# Patient Record
Sex: Female | Born: 1955 | Race: White | Hispanic: No | Marital: Married | Smoking: Never smoker
Health system: Southern US, Community
[De-identification: ages and names within clinical notes are randomized; demographics above are authoritative.]

## PROBLEM LIST (undated history)

## (undated) DIAGNOSIS — E559 Vitamin D deficiency, unspecified: Secondary | ICD-10-CM

## (undated) DIAGNOSIS — M1990 Unspecified osteoarthritis, unspecified site: Secondary | ICD-10-CM

## (undated) DIAGNOSIS — F419 Anxiety disorder, unspecified: Secondary | ICD-10-CM

## (undated) DIAGNOSIS — Z860100 Personal history of colon polyps, unspecified: Secondary | ICD-10-CM

## (undated) DIAGNOSIS — Z872 Personal history of diseases of the skin and subcutaneous tissue: Secondary | ICD-10-CM

## (undated) DIAGNOSIS — D696 Thrombocytopenia, unspecified: Secondary | ICD-10-CM

## (undated) DIAGNOSIS — Z8719 Personal history of other diseases of the digestive system: Secondary | ICD-10-CM

## (undated) DIAGNOSIS — Z85820 Personal history of malignant melanoma of skin: Secondary | ICD-10-CM

## (undated) DIAGNOSIS — D649 Anemia, unspecified: Secondary | ICD-10-CM

## (undated) DIAGNOSIS — M810 Age-related osteoporosis without current pathological fracture: Secondary | ICD-10-CM

## (undated) DIAGNOSIS — E785 Hyperlipidemia, unspecified: Secondary | ICD-10-CM

## (undated) HISTORY — DX: Personal history of colon polyps, unspecified: Z86.0100

## (undated) HISTORY — DX: Thrombocytopenia, unspecified: D69.6

## (undated) HISTORY — PX: COLONOSCOPY W/ POLYPECTOMY: SHX1380

## (undated) HISTORY — DX: Age-related osteoporosis without current pathological fracture: M81.0

## (undated) HISTORY — PX: PARTIAL HYSTERECTOMY: SHX80

## (undated) HISTORY — DX: Personal history of malignant melanoma of skin: Z85.820

## (undated) HISTORY — DX: Anxiety disorder, unspecified: F41.9

## (undated) HISTORY — DX: Unspecified osteoarthritis, unspecified site: M19.90

## (undated) HISTORY — DX: Anemia, unspecified: D64.9

## (undated) HISTORY — PX: BREAST LUMPECTOMY: SHX2

## (undated) HISTORY — DX: Personal history of diseases of the skin and subcutaneous tissue: Z87.2

## (undated) HISTORY — DX: Hyperlipidemia, unspecified: E78.5

## (undated) HISTORY — PX: CATARACT EXTRACTION: SUR2

## (undated) HISTORY — PX: KNEE SURGERY: SHX244

## (undated) HISTORY — PX: BREAST BIOPSY: SHX20

## (undated) HISTORY — DX: Vitamin D deficiency, unspecified: E55.9

## (undated) HISTORY — DX: Personal history of other diseases of the digestive system: Z87.19

---

## 2015-10-13 DIAGNOSIS — M810 Age-related osteoporosis without current pathological fracture: Secondary | ICD-10-CM | POA: Insufficient documentation

## 2016-02-24 DIAGNOSIS — D2239 Melanocytic nevi of other parts of face: Secondary | ICD-10-CM | POA: Diagnosis not present

## 2016-02-24 DIAGNOSIS — D225 Melanocytic nevi of trunk: Secondary | ICD-10-CM | POA: Diagnosis not present

## 2016-02-24 DIAGNOSIS — L821 Other seborrheic keratosis: Secondary | ICD-10-CM | POA: Diagnosis not present

## 2016-04-20 DIAGNOSIS — Z1231 Encounter for screening mammogram for malignant neoplasm of breast: Secondary | ICD-10-CM | POA: Diagnosis not present

## 2016-06-29 DIAGNOSIS — D696 Thrombocytopenia, unspecified: Secondary | ICD-10-CM | POA: Diagnosis not present

## 2016-06-29 DIAGNOSIS — E785 Hyperlipidemia, unspecified: Secondary | ICD-10-CM | POA: Diagnosis not present

## 2016-06-29 DIAGNOSIS — E559 Vitamin D deficiency, unspecified: Secondary | ICD-10-CM | POA: Diagnosis not present

## 2016-07-20 DIAGNOSIS — Z23 Encounter for immunization: Secondary | ICD-10-CM | POA: Diagnosis not present

## 2016-10-21 DIAGNOSIS — M1712 Unilateral primary osteoarthritis, left knee: Secondary | ICD-10-CM | POA: Diagnosis not present

## 2016-11-03 DIAGNOSIS — Z23 Encounter for immunization: Secondary | ICD-10-CM | POA: Diagnosis not present

## 2016-11-03 DIAGNOSIS — Z01419 Encounter for gynecological examination (general) (routine) without abnormal findings: Secondary | ICD-10-CM | POA: Diagnosis not present

## 2016-11-03 DIAGNOSIS — Z1239 Encounter for other screening for malignant neoplasm of breast: Secondary | ICD-10-CM | POA: Diagnosis not present

## 2017-01-05 DIAGNOSIS — E785 Hyperlipidemia, unspecified: Secondary | ICD-10-CM | POA: Diagnosis not present

## 2017-01-05 DIAGNOSIS — E559 Vitamin D deficiency, unspecified: Secondary | ICD-10-CM | POA: Diagnosis not present

## 2017-01-05 DIAGNOSIS — D696 Thrombocytopenia, unspecified: Secondary | ICD-10-CM | POA: Diagnosis not present

## 2017-02-22 DIAGNOSIS — D225 Melanocytic nevi of trunk: Secondary | ICD-10-CM | POA: Diagnosis not present

## 2017-02-22 DIAGNOSIS — L814 Other melanin hyperpigmentation: Secondary | ICD-10-CM | POA: Diagnosis not present

## 2017-02-22 DIAGNOSIS — D2239 Melanocytic nevi of other parts of face: Secondary | ICD-10-CM | POA: Diagnosis not present

## 2017-04-21 DIAGNOSIS — Z1231 Encounter for screening mammogram for malignant neoplasm of breast: Secondary | ICD-10-CM | POA: Diagnosis not present

## 2017-07-06 DIAGNOSIS — M810 Age-related osteoporosis without current pathological fracture: Secondary | ICD-10-CM | POA: Diagnosis not present

## 2017-07-06 DIAGNOSIS — E785 Hyperlipidemia, unspecified: Secondary | ICD-10-CM | POA: Diagnosis not present

## 2017-07-06 DIAGNOSIS — D696 Thrombocytopenia, unspecified: Secondary | ICD-10-CM | POA: Diagnosis not present

## 2017-07-06 DIAGNOSIS — E559 Vitamin D deficiency, unspecified: Secondary | ICD-10-CM | POA: Diagnosis not present

## 2017-07-26 DIAGNOSIS — M810 Age-related osteoporosis without current pathological fracture: Secondary | ICD-10-CM | POA: Diagnosis not present

## 2017-07-26 DIAGNOSIS — M8589 Other specified disorders of bone density and structure, multiple sites: Secondary | ICD-10-CM | POA: Diagnosis not present

## 2017-10-22 DIAGNOSIS — Z23 Encounter for immunization: Secondary | ICD-10-CM | POA: Diagnosis not present

## 2017-11-04 DIAGNOSIS — Z01818 Encounter for other preprocedural examination: Secondary | ICD-10-CM | POA: Diagnosis not present

## 2017-11-04 DIAGNOSIS — H2511 Age-related nuclear cataract, right eye: Secondary | ICD-10-CM | POA: Diagnosis not present

## 2017-11-25 DIAGNOSIS — H2511 Age-related nuclear cataract, right eye: Secondary | ICD-10-CM | POA: Diagnosis not present

## 2017-11-25 DIAGNOSIS — Z01818 Encounter for other preprocedural examination: Secondary | ICD-10-CM | POA: Diagnosis not present

## 2017-12-05 DIAGNOSIS — Z1239 Encounter for other screening for malignant neoplasm of breast: Secondary | ICD-10-CM | POA: Diagnosis not present

## 2017-12-05 DIAGNOSIS — Z01419 Encounter for gynecological examination (general) (routine) without abnormal findings: Secondary | ICD-10-CM | POA: Diagnosis not present

## 2017-12-05 DIAGNOSIS — N838 Other noninflammatory disorders of ovary, fallopian tube and broad ligament: Secondary | ICD-10-CM | POA: Diagnosis not present

## 2017-12-12 DIAGNOSIS — N838 Other noninflammatory disorders of ovary, fallopian tube and broad ligament: Secondary | ICD-10-CM | POA: Diagnosis not present

## 2017-12-13 DIAGNOSIS — H04123 Dry eye syndrome of bilateral lacrimal glands: Secondary | ICD-10-CM | POA: Diagnosis not present

## 2017-12-13 DIAGNOSIS — H35033 Hypertensive retinopathy, bilateral: Secondary | ICD-10-CM | POA: Diagnosis not present

## 2017-12-13 DIAGNOSIS — H2511 Age-related nuclear cataract, right eye: Secondary | ICD-10-CM | POA: Diagnosis not present

## 2017-12-13 DIAGNOSIS — H259 Unspecified age-related cataract: Secondary | ICD-10-CM | POA: Diagnosis not present

## 2018-01-03 DIAGNOSIS — D696 Thrombocytopenia, unspecified: Secondary | ICD-10-CM | POA: Diagnosis not present

## 2018-01-03 DIAGNOSIS — E785 Hyperlipidemia, unspecified: Secondary | ICD-10-CM | POA: Diagnosis not present

## 2018-01-03 DIAGNOSIS — M810 Age-related osteoporosis without current pathological fracture: Secondary | ICD-10-CM | POA: Diagnosis not present

## 2018-01-03 DIAGNOSIS — E559 Vitamin D deficiency, unspecified: Secondary | ICD-10-CM | POA: Diagnosis not present

## 2018-01-10 DIAGNOSIS — H259 Unspecified age-related cataract: Secondary | ICD-10-CM | POA: Diagnosis not present

## 2018-01-10 DIAGNOSIS — H40013 Open angle with borderline findings, low risk, bilateral: Secondary | ICD-10-CM | POA: Diagnosis not present

## 2018-01-10 DIAGNOSIS — E785 Hyperlipidemia, unspecified: Secondary | ICD-10-CM | POA: Diagnosis not present

## 2018-01-10 DIAGNOSIS — H2512 Age-related nuclear cataract, left eye: Secondary | ICD-10-CM | POA: Diagnosis not present

## 2018-03-07 DIAGNOSIS — L814 Other melanin hyperpigmentation: Secondary | ICD-10-CM | POA: Diagnosis not present

## 2018-03-07 DIAGNOSIS — D2239 Melanocytic nevi of other parts of face: Secondary | ICD-10-CM | POA: Diagnosis not present

## 2018-03-07 DIAGNOSIS — D225 Melanocytic nevi of trunk: Secondary | ICD-10-CM | POA: Diagnosis not present

## 2018-03-23 DIAGNOSIS — G8929 Other chronic pain: Secondary | ICD-10-CM | POA: Insufficient documentation

## 2018-03-23 DIAGNOSIS — M25511 Pain in right shoulder: Secondary | ICD-10-CM | POA: Diagnosis not present

## 2019-01-24 ENCOUNTER — Encounter: Payer: Self-pay | Admitting: Sports Medicine

## 2019-01-24 ENCOUNTER — Ambulatory Visit (INDEPENDENT_AMBULATORY_CARE_PROVIDER_SITE_OTHER): Payer: Commercial Managed Care - PPO

## 2019-01-24 ENCOUNTER — Ambulatory Visit (INDEPENDENT_AMBULATORY_CARE_PROVIDER_SITE_OTHER): Payer: Commercial Managed Care - PPO | Admitting: Sports Medicine

## 2019-01-24 ENCOUNTER — Other Ambulatory Visit: Payer: Self-pay | Admitting: Sports Medicine

## 2019-01-24 ENCOUNTER — Ambulatory Visit: Payer: Self-pay

## 2019-01-24 ENCOUNTER — Other Ambulatory Visit: Payer: Self-pay

## 2019-01-24 DIAGNOSIS — M779 Enthesopathy, unspecified: Secondary | ICD-10-CM

## 2019-01-24 DIAGNOSIS — M79671 Pain in right foot: Secondary | ICD-10-CM

## 2019-01-24 DIAGNOSIS — M7751 Other enthesopathy of right foot: Secondary | ICD-10-CM

## 2019-01-24 DIAGNOSIS — M7741 Metatarsalgia, right foot: Secondary | ICD-10-CM | POA: Diagnosis not present

## 2019-01-24 DIAGNOSIS — M792 Neuralgia and neuritis, unspecified: Secondary | ICD-10-CM

## 2019-01-24 MED ORDER — PREDNISONE 10 MG (21) PO TBPK: ORAL_TABLET | ORAL | 0 refills | Status: AC

## 2019-01-24 MED ORDER — TRIAMCINOLONE ACETONIDE 10 MG/ML IJ SUSP
10.0000 mg | Freq: Once | INTRAMUSCULAR | Status: AC
  Administered 2019-01-24: 10 mg

## 2019-01-24 NOTE — Progress Notes (Signed)
Subjective: Elizabeth Randall is a 63 y.o. female patient who presents to office for evaluation of right foot pain. Patient complains of progressive pain especially over the last 3 to 4 months in the right foot at the ball.  Ranks pain 7/10 and is now interferring with daily activities. Patient has tried changing shoes, padding, insoles, tramadol with no relief in symptoms. Patient denies any other pedal complaints. Denies injury/trip/fall/sprain/any causative factors.   Review of Systems  All other systems reviewed and are negative.    There are no problems to display for this patient.   No current outpatient medications on file prior to visit.   No current facility-administered medications on file prior to visit.    Allergies  Allergen Reactions  . Celecoxib Nausea Only    UPSETS STOMACH  . Nisoldipine Other (See Comments)    unknown  . Piroxicam Other (See Comments)    TONGUE SWELLS  . Rofecoxib Rash    Objective:  General: Alert and oriented x3 in no acute distress  Dermatology: No open lesions bilateral lower extremities, no webspace macerations, no ecchymosis bilateral, all nails x 10 are well manicured.  Vascular: Dorsalis Pedis and Posterior Tibial pedal pulses palpable, Capillary Fill Time 3 seconds,(+) pedal hair growth bilateral, no edema bilateral lower extremities, Temperature gradient within normal limits.  Neurology: Johney Maine sensation intact via light touch bilateral.  Musculoskeletal: Mild tenderness with palpation at right third metatarsal phalangeal joint as well as with compression of the metatarsal heads in between the second and third metatarsals of the right foot.  There is fat pad atrophy of the right foot.  Mild digital contractures noted.  No other acute findings. Strength within normal limits in all groups bilateral.   Gait: Antalgic gait  Xrays  Right Foot   Impression: Normal osseous mineralization, there is mild soft tissue swelling noted at the plantar  forefoot, metatarsal length of the second and third metatarsals are almost equal there is mild digital contracture no fracture or significant dislocation, no other acute findings.  Assessment and Plan: Problem List Items Addressed This Visit    None    Visit Diagnoses    Capsulitis    -  Primary   Relevant Medications   triamcinolone acetonide (KENALOG) 10 MG/ML injection 10 mg (Completed) (Start on 01/24/2019  8:30 PM)   Other Relevant Orders   DG Foot Complete Right (Completed)   Neuritis       Right foot pain       Relevant Medications   triamcinolone acetonide (KENALOG) 10 MG/ML injection 10 mg (Completed) (Start on 01/24/2019  8:30 PM)   Metatarsalgia of right foot           -Complete examination performed -Xrays reviewed -Discussed treatment options for capsulitis versus neuroma right foot -Rx prednisone Dosepak to take as instructed -Advised rest from activity and once it starts to feel better may slowly increase back to walking -After oral consent and aseptic prep, injected a mixture containing 1 ml of 2%  plain lidocaine, 1 ml 0.5% plain marcaine, 0.5 ml of kenalog 10 and 0.5 ml of dexamethasone phosphate into the right third metatarsophalangeal joint via a plantar approach without complication. Post-injection care discussed with patient.  -Applied strapping to plantar forefoot to offload the metatarsals and instructed patient on proper use of strapping that is removable -Recommend good supportive shoes rest ice elevation and continue with topical pain creams or rubs as needed -Patient to return to office in 1 month or sooner if  condition worsens.  Advised patient if she still continues to be painful may benefit from Cam boot and MRI.  Landis Martins, DPM

## 2019-02-15 ENCOUNTER — Ambulatory Visit: Payer: Commercial Managed Care - PPO | Admitting: Sports Medicine

## 2019-02-15 ENCOUNTER — Other Ambulatory Visit: Payer: Self-pay

## 2019-02-15 ENCOUNTER — Encounter: Payer: Self-pay | Admitting: Sports Medicine

## 2019-02-15 DIAGNOSIS — M7741 Metatarsalgia, right foot: Secondary | ICD-10-CM | POA: Diagnosis not present

## 2019-02-15 DIAGNOSIS — M792 Neuralgia and neuritis, unspecified: Secondary | ICD-10-CM

## 2019-02-15 DIAGNOSIS — M79671 Pain in right foot: Secondary | ICD-10-CM | POA: Diagnosis not present

## 2019-02-15 DIAGNOSIS — M779 Enthesopathy, unspecified: Secondary | ICD-10-CM | POA: Diagnosis not present

## 2019-02-15 NOTE — Progress Notes (Signed)
Subjective: Elizabeth Randall is a 64 y.o. female patient who returns to office for follow-up evaluation of right foot pain.  Patient reports that the pain is doing a lot better every once in a while she will get a quick bruising of pain that is about 2 out of 10 but a lot better patient reports that she has resumed walking for exercise with her husband without any problems and has been using her removable metatarsal padding which seems to be very helpful.  Patient denies any significant redness warmth swelling drainage or bruising since after receiving injection last visit.  No other pedal complaints noted.  There are no problems to display for this patient.   Current Outpatient Medications on File Prior to Visit  Medication Sig Dispense Refill  . ALPRAZolam (XANAX) 0.25 MG tablet Take 0.25 mg by mouth 3 (three) times daily as needed.    . AMITIZA 8 MCG capsule Take 8 mcg by mouth 2 (two) times daily.    . citalopram (CELEXA) 20 MG tablet Take 20 mg by mouth daily.    Marland Kitchen HYDROcodone-acetaminophen (NORCO/VICODIN) 5-325 MG tablet     . predniSONE (STERAPRED UNI-PAK 21 TAB) 10 MG (21) TBPK tablet Take as directed 21 tablet 0  . PREMARIN 0.3 MG tablet Take 0.3 mg by mouth daily.    Marland Kitchen PREMARIN vaginal cream     . simvastatin (ZOCOR) 20 MG tablet Take 20 mg by mouth at bedtime.    . traMADol (ULTRAM) 50 MG tablet      No current facility-administered medications on file prior to visit.    Allergies  Allergen Reactions  . Celecoxib Nausea Only    UPSETS STOMACH  . Nisoldipine Other (See Comments)    unknown  . Piroxicam Other (See Comments)    TONGUE SWELLS  . Rofecoxib Rash    Objective:  General: Alert and oriented x3 in no acute distress  Dermatology: No open lesions bilateral lower extremities, no webspace macerations, no ecchymosis bilateral, all nails x 10 are well manicured.  Vascular: Dorsalis Pedis and Posterior Tibial pedal pulses palpable, Capillary Fill Time 3 seconds,(+) pedal  hair growth bilateral, no edema bilateral lower extremities, Temperature gradient within normal limits.  Neurology: Johney Maine sensation intact via light touch bilateral.  Musculoskeletal: No reproducible tenderness with palpation at right third metatarsal phalangeal joint as well as no pain with compression of the metatarsal heads in between the second and third metatarsals of the right foot.  There is fat pad atrophy of the right foot.  Mild digital contractures noted.  No other acute findings. Strength within normal limits in all groups bilateral.   Assessment and Plan: Problem List Items Addressed This Visit    None    Visit Diagnoses    Capsulitis    -  Primary   Neuritis       Right foot pain       Metatarsalgia of right foot          -Complete examination performed -Re-Discussed continued care for capsulitis versus neuroma right foot -Patient to continue with metatarsal padding and once this padding wears out advised patient to get over-the-counter insoles or Dr. Felicie Morn gel metatarsal padding -Advised patient to avoid activities that may reexacerbate her foot pain -Continue with good supportive shoes daily for activity -Continue with close monitoring of pain worsens or reoccurs may benefit from reevaluation -Patient to return to office in as needed or sooner if problems or issues arise.  Landis Martins, DPM

## 2020-01-02 ENCOUNTER — Other Ambulatory Visit: Payer: Self-pay | Admitting: Orthopedic Surgery

## 2020-01-02 DIAGNOSIS — M542 Cervicalgia: Secondary | ICD-10-CM

## 2020-01-20 ENCOUNTER — Other Ambulatory Visit: Payer: Self-pay

## 2020-01-20 ENCOUNTER — Ambulatory Visit
Admission: RE | Admit: 2020-01-20 | Discharge: 2020-01-20 | Disposition: A | Discharge status: Home / Self Care | Payer: Commercial Managed Care - PPO | Source: Ambulatory Visit | Attending: Orthopedic Surgery | Admitting: Orthopedic Surgery

## 2020-01-20 DIAGNOSIS — M542 Cervicalgia: Secondary | ICD-10-CM

## 2020-03-12 ENCOUNTER — Other Ambulatory Visit: Payer: Self-pay

## 2020-03-12 ENCOUNTER — Emergency Department (HOSPITAL_COMMUNITY)
Admission: EM | Admit: 2020-03-12 | Discharge: 2020-03-12 | Disposition: A | Discharge status: Home / Self Care | Payer: Commercial Managed Care - PPO | Attending: Emergency Medicine | Admitting: Emergency Medicine

## 2020-03-12 ENCOUNTER — Encounter (HOSPITAL_COMMUNITY): Payer: Self-pay

## 2020-03-12 ENCOUNTER — Emergency Department (HOSPITAL_COMMUNITY): Payer: Commercial Managed Care - PPO

## 2020-03-12 DIAGNOSIS — R531 Weakness: Secondary | ICD-10-CM

## 2020-03-12 DIAGNOSIS — E860 Dehydration: Secondary | ICD-10-CM | POA: Diagnosis not present

## 2020-03-12 DIAGNOSIS — U071 COVID-19: Secondary | ICD-10-CM

## 2020-03-12 LAB — URINALYSIS, ROUTINE W REFLEX MICROSCOPIC
Bilirubin Urine: NEGATIVE
Glucose, UA: NEGATIVE mg/dL
Hgb urine dipstick: NEGATIVE
Ketones, ur: >80 mg/dL — AB
Leukocytes,Ua: NEGATIVE
Nitrite: NEGATIVE
Protein, ur: 30 mg/dL — AB
Specific Gravity, Urine: 1.02 (ref 1.005–1.030)
pH: 6 (ref 5.0–8.0)

## 2020-03-12 LAB — COMPREHENSIVE METABOLIC PANEL
ALT: 18 U/L (ref 0–44)
AST: 33 U/L (ref 15–41)
Albumin: 3.3 g/dL — ABNORMAL LOW (ref 3.5–5.0)
Alkaline Phosphatase: 44 U/L (ref 38–126)
Anion gap: 12 (ref 5–15)
BUN: 10 mg/dL (ref 8–23)
CO2: 24 mmol/L (ref 22–32)
Calcium: 8.6 mg/dL — ABNORMAL LOW (ref 8.9–10.3)
Chloride: 98 mmol/L (ref 98–111)
Creatinine, Ser: 0.55 mg/dL (ref 0.44–1.00)
GFR, Estimated: >60 mL/min (ref >60)
Glucose, Bld: 99 mg/dL (ref 70–99)
Potassium: 3.3 mmol/L — ABNORMAL LOW (ref 3.5–5.1)
Sodium: 134 mmol/L — ABNORMAL LOW (ref 135–145)
Total Bilirubin: 0.8 mg/dL (ref 0.3–1.2)
Total Protein: 5.9 g/dL — ABNORMAL LOW (ref 6.5–8.1)

## 2020-03-12 LAB — URINALYSIS, MICROSCOPIC (REFLEX)

## 2020-03-12 LAB — LACTIC ACID, PLASMA
Lactic Acid, Venous: 1.1 mmol/L (ref 0.5–1.9)
Lactic Acid, Venous: 1.3 mmol/L (ref 0.5–1.9)

## 2020-03-12 LAB — CBC WITH DIFFERENTIAL/PLATELET
Abs Immature Granulocytes: 0.01 10*3/uL (ref 0.00–0.07)
Basophils Absolute: 0 10*3/uL (ref 0.0–0.1)
Basophils Relative: 0 %
Eosinophils Absolute: 0 10*3/uL (ref 0.0–0.5)
Eosinophils Relative: 0 %
HCT: 37.7 % (ref 36.0–46.0)
Hemoglobin: 12.2 g/dL (ref 12.0–15.0)
Immature Granulocytes: 0 %
Lymphocytes Relative: 16 %
Lymphs Abs: 0.4 10*3/uL — ABNORMAL LOW (ref 0.7–4.0)
MCH: 28.8 pg (ref 26.0–34.0)
MCHC: 32.4 g/dL (ref 30.0–36.0)
MCV: 88.9 fL (ref 80.0–100.0)
Monocytes Absolute: 0.1 10*3/uL (ref 0.1–1.0)
Monocytes Relative: 5 %
Neutro Abs: 1.9 10*3/uL (ref 1.7–7.7)
Neutrophils Relative %: 79 %
Platelets: 85 10*3/uL — ABNORMAL LOW (ref 150–400)
RBC: 4.24 MIL/uL (ref 3.87–5.11)
RDW: 13.6 % (ref 11.5–15.5)
WBC: 2.5 10*3/uL — ABNORMAL LOW (ref 4.0–10.5)
nRBC: 0 % (ref 0.0–0.2)

## 2020-03-12 MED ORDER — IBUPROFEN 400 MG PO TABS
400.0000 mg | ORAL_TABLET | Freq: Once | ORAL | Status: AC
  Administered 2020-03-12: 400 mg via ORAL
  Filled 2020-03-12: qty 1

## 2020-03-12 MED ORDER — ACETAMINOPHEN 325 MG PO TABS
650.0000 mg | ORAL_TABLET | Freq: Once | ORAL | Status: AC | PRN
  Administered 2020-03-12: 650 mg via ORAL
  Filled 2020-03-12: qty 2

## 2020-03-12 MED ORDER — ONDANSETRON HCL 4 MG/2ML IJ SOLN
4.0000 mg | Freq: Once | INTRAMUSCULAR | Status: AC
  Administered 2020-03-12: 4 mg via INTRAVENOUS
  Filled 2020-03-12: qty 2

## 2020-03-12 MED ORDER — ONDANSETRON 4 MG PO TBDP: 4.0000 mg | ORAL_TABLET | Freq: Three times a day (TID) | ORAL | 0 refills | Status: AC | PRN

## 2020-03-12 MED ORDER — PREDNISONE 20 MG PO TABS: 40.0000 mg | ORAL_TABLET | Freq: Every day | ORAL | 0 refills | Status: AC

## 2020-03-12 MED ORDER — LACTATED RINGERS IV BOLUS
1000.0000 mL | Freq: Once | INTRAVENOUS | Status: AC
  Administered 2020-03-12: 1000 mL via INTRAVENOUS

## 2020-03-12 NOTE — ED Provider Notes (Signed)
[facility]PART[facility] Provider Note   CSN: [id number] Arrival date & time: 03/12/20  1205     History No chief complaint on file.   Elizabeth Randall is a 65 y.o. female.  Patient is a 65 year old female with a history of hyperlipidemia, constipation who is unvaccinated and presenting today with generalized weakness, nausea, poor appetite and fatigue. Patient symptoms have been present for the last 10 days. Patient did test positive for Covid last week. She has been isolating at home with her husband who is also sick. She has had some cough but denies any shortness of breath. She has no desire to eat and reports even when she tries to fix something the nausea is preventing her from eating anything. She just feels generally weak and when she stands and tries to ambulate she feels like she might pass out. She has not received any medications since being diagnosed with COVID or received any transfusions.  The history is provided by the patient.       History reviewed. No pertinent past medical history.  There are no problems to display for this patient.   History reviewed. No pertinent surgical history.   OB History   No obstetric history on file.     No family history on file.     Home Medications Prior to Admission medications   Medication Sig Start Date End Date Taking? Authorizing Provider  ALPRAZolam (XANAX) 0.25 MG tablet Take 0.25 mg by mouth 3 (three) times daily as needed for anxiety. 02/12/20  Yes [provider]  AMITIZA 8 MCG capsule Take 8 mcg by mouth 2 (two) times daily. 03/03/20  Yes [provider]  citalopram (CELEXA) 20 MG tablet Take 20 mg by mouth daily. 01/29/20  Yes [provider]  PREMARIN 0.3 MG tablet Take 0.3 mg by mouth daily. 03/03/20  Yes [provider]  simvastatin (ZOCOR) 20 MG tablet Take 20 mg by mouth at bedtime. 01/14/20  Yes [provider]    Allergies    Patient has  no known allergies.  Review of Systems   Review of Systems  All other systems reviewed and are negative.   Physical Exam Updated Vital Signs BP 97/64   Pulse 99   Temp (!) 102.3 F (39.1 C) (Oral)   Resp 18   SpO2 98%   Physical Exam Vitals and nursing note reviewed.  Constitutional:      General: She is not in acute distress.    Appearance: She is well-developed and well-nourished.  HENT:     Head: Normocephalic and atraumatic.     Mouth/Throat:     Mouth: Oropharynx is clear and moist.  Eyes:     Extraocular Movements: EOM normal.     Conjunctiva/sclera: Conjunctivae normal.     Pupils: Pupils are equal, round, and reactive to light.  Cardiovascular:     Rate and Rhythm: Normal rate and regular rhythm.     Pulses: Intact distal pulses.     Heart sounds: No murmur heard.   Pulmonary:     Effort: Pulmonary effort is normal. No respiratory distress.     Breath sounds: Normal breath sounds. No wheezing or rales.     Comments: Fine crackles noted in the bases on exam Abdominal:     General: There is no distension.     Palpations: Abdomen is soft.     Tenderness: There is no abdominal tenderness. There is no guarding or rebound.  Musculoskeletal:  General: No tenderness or edema. Normal range of motion.     Cervical back: Normal range of motion and neck supple.     Right lower leg: No edema.     Left lower leg: No edema.  Skin:    General: Skin is warm and dry.     Findings: No erythema or rash.  Neurological:     General: No focal deficit present.     Mental Status: She is alert and oriented to person, place, and time. Mental status is at baseline.  Psychiatric:        Mood and Affect: Mood and affect and mood normal.        Behavior: Behavior normal.        Thought Content: Thought content normal.     ED Results / Procedures / Treatments   Labs (all labs ordered are listed, but only abnormal results are displayed) Labs Reviewed  COMPREHENSIVE  METABOLIC PANEL - Abnormal; Notable for the following components:      Result Value   Sodium 134 (*)    Potassium 3.3 (*)    Calcium 8.6 (*)    Total Protein 5.9 (*)    Albumin 3.3 (*)    All other components within normal limits  CBC WITH DIFFERENTIAL/PLATELET - Abnormal; Notable for the following components:   WBC 2.5 (*)    Platelets 85 (*)    Lymphs Abs 0.4 (*)    All other components within normal limits  LACTIC ACID, PLASMA  LACTIC ACID, PLASMA  URINALYSIS, ROUTINE W REFLEX MICROSCOPIC    EKG None  Radiology DG Chest 1 View  Result Date: 03/12/2020 CLINICAL DATA:  COVID positive 3 days, dyspnea, weakness, vomiting EXAM: CHEST  1 VIEW COMPARISON:  10/18/2011 chest radiograph. FINDINGS: Stable cardiomediastinal silhouette with normal heart size. No pneumothorax. No pleural effusion. Moderate hazy patchy opacities throughout the peripheral left lung, new. Clear right lung. IMPRESSION: New moderate hazy patchy opacities throughout the peripheral left lung compatible with COVID-19 pneumonia. Electronically Signed   By: Ilona Sorrel M.D.   On: 03/12/2020 13:23    Procedures Procedures   Medications Ordered in ED Medications  lactated ringers bolus 1,000 mL (has no administration in time range)  ondansetron (ZOFRAN) injection 4 mg (has no administration in time range)  acetaminophen (TYLENOL) tablet 650 mg (650 mg Oral Given 03/12/20 1248)    ED Course  I have reviewed the triage vital signs and the nursing notes.  Pertinent labs & imaging results that were available during my care of the patient were reviewed by me and considered in my medical decision making (see chart for details).    MDM Rules/Calculators/A&P                          Pt with symptoms consistent with COVID.  Well appearing here.  No signs of breathing difficulty  No signs of pharyngitis, otitis or abnormal abdominal findings. Patient complaining of significant weakness due to poor oral intake in the last  10 days. Upon arrival here she is febrile to 102.3. Borderline blood pressure and tachycardia. Oxygen saturation of 98% on room air in no respiratory issues. Will check labs to ensure no acute kidney injury. Patient has not been vaccinated has not received any medications however now she is 10 days out and outside of the window for any oral or infusion treatment. Will give IV fluids and Zofran. Chest x-ray does show new moderate hazy patchy  opacities throughout the periphery of the left lung compatible with COVID. CBC with leukopenia of 2.4 but otherwise within normal limits, CMP with normal renal function and only minimal hypokalemia and hyponatremia. Will evaluate patient after fluids and Zofran. She was already given Tylenol for her fever.   Final Clinical Impression(s) / ED Diagnoses Final diagnoses:  COVID  Dehydration  Weakness    Rx / DC Orders ED Discharge Orders         Ordered    ondansetron (ZOFRAN ODT) 4 MG disintegrating tablet  Every 8 hours PRN        03/12/20 1641    predniSONE (DELTASONE) 20 MG tablet  Daily        03/12/20 1641           Blanchie Dessert, MD 03/12/20 1642

## 2020-03-12 NOTE — Discharge Instructions (Signed)
All your blood tests look normal today.  Your oxygen has been 99 200% the whole time you have been here.  You are dehydrated because you are not eating or drinking well and you were given IV fluids today.  You should at least try to drink boost to get some calories and make sure you keep forcing yourself to drink fluids.  Continue Tylenol as needed for fever.  I would expect your symptoms should start improving in the next few days.  If you develop severe shortness of breath even being short of breath when you are just resting you need to return to the hospital.

## 2020-03-12 NOTE — ED Triage Notes (Signed)
Patient arrived by [facility] with complaint of weakness, fatigue and back pain. Diagnosed with covid 1 week ago and states that she unable to care for herself due to the weakness. Alert and oriented, NAD

## 2020-03-12 NOTE — ED Notes (Signed)
Pulse ox while ambulating in room maintained at 99 to 100 percent, states she feels very weak and that it is hard for her to ambulate, ambulated without assistance

## 2020-12-02 ENCOUNTER — Other Ambulatory Visit: Payer: Self-pay | Admitting: Neurosurgery

## 2020-12-02 DIAGNOSIS — M4800 Spinal stenosis, site unspecified: Secondary | ICD-10-CM

## 2021-01-02 DIAGNOSIS — Z1331 Encounter for screening for depression: Secondary | ICD-10-CM | POA: Diagnosis not present

## 2021-01-02 DIAGNOSIS — E785 Hyperlipidemia, unspecified: Secondary | ICD-10-CM | POA: Diagnosis not present

## 2021-01-02 DIAGNOSIS — K5909 Other constipation: Secondary | ICD-10-CM | POA: Diagnosis not present

## 2021-01-02 DIAGNOSIS — D696 Thrombocytopenia, unspecified: Secondary | ICD-10-CM | POA: Diagnosis not present

## 2021-01-02 DIAGNOSIS — E559 Vitamin D deficiency, unspecified: Secondary | ICD-10-CM | POA: Diagnosis not present

## 2021-01-02 DIAGNOSIS — M810 Age-related osteoporosis without current pathological fracture: Secondary | ICD-10-CM | POA: Diagnosis not present

## 2021-01-02 DIAGNOSIS — M8589 Other specified disorders of bone density and structure, multiple sites: Secondary | ICD-10-CM | POA: Diagnosis not present

## 2021-01-02 DIAGNOSIS — Z9181 History of falling: Secondary | ICD-10-CM | POA: Diagnosis not present

## 2021-03-02 DIAGNOSIS — Z01419 Encounter for gynecological examination (general) (routine) without abnormal findings: Secondary | ICD-10-CM | POA: Diagnosis not present

## 2021-03-02 DIAGNOSIS — M810 Age-related osteoporosis without current pathological fracture: Secondary | ICD-10-CM | POA: Diagnosis not present

## 2021-03-02 DIAGNOSIS — Z90710 Acquired absence of both cervix and uterus: Secondary | ICD-10-CM | POA: Diagnosis not present

## 2021-04-08 DIAGNOSIS — K5900 Constipation, unspecified: Secondary | ICD-10-CM | POA: Diagnosis not present

## 2021-04-22 DIAGNOSIS — M810 Age-related osteoporosis without current pathological fracture: Secondary | ICD-10-CM | POA: Diagnosis not present

## 2021-05-27 DIAGNOSIS — L821 Other seborrheic keratosis: Secondary | ICD-10-CM | POA: Diagnosis not present

## 2021-05-27 DIAGNOSIS — D2239 Melanocytic nevi of other parts of face: Secondary | ICD-10-CM | POA: Diagnosis not present

## 2021-05-27 DIAGNOSIS — L814 Other melanin hyperpigmentation: Secondary | ICD-10-CM | POA: Diagnosis not present

## 2021-05-27 DIAGNOSIS — D225 Melanocytic nevi of trunk: Secondary | ICD-10-CM | POA: Diagnosis not present

## 2021-06-02 DIAGNOSIS — K648 Other hemorrhoids: Secondary | ICD-10-CM | POA: Diagnosis not present

## 2021-06-02 DIAGNOSIS — Z1211 Encounter for screening for malignant neoplasm of colon: Secondary | ICD-10-CM | POA: Diagnosis not present

## 2021-06-02 DIAGNOSIS — Z86010 Personal history of colonic polyps: Secondary | ICD-10-CM | POA: Diagnosis not present

## 2021-07-03 DIAGNOSIS — D696 Thrombocytopenia, unspecified: Secondary | ICD-10-CM | POA: Diagnosis not present

## 2021-07-03 DIAGNOSIS — E785 Hyperlipidemia, unspecified: Secondary | ICD-10-CM | POA: Diagnosis not present

## 2021-07-03 DIAGNOSIS — F419 Anxiety disorder, unspecified: Secondary | ICD-10-CM | POA: Diagnosis not present

## 2021-07-03 DIAGNOSIS — E559 Vitamin D deficiency, unspecified: Secondary | ICD-10-CM | POA: Diagnosis not present

## 2021-07-03 DIAGNOSIS — M810 Age-related osteoporosis without current pathological fracture: Secondary | ICD-10-CM | POA: Diagnosis not present

## 2021-07-03 DIAGNOSIS — K5909 Other constipation: Secondary | ICD-10-CM | POA: Diagnosis not present

## 2021-07-06 DIAGNOSIS — H35013 Changes in retinal vascular appearance, bilateral: Secondary | ICD-10-CM | POA: Diagnosis not present

## 2021-07-07 DIAGNOSIS — K5900 Constipation, unspecified: Secondary | ICD-10-CM | POA: Diagnosis not present

## 2021-07-07 DIAGNOSIS — K649 Unspecified hemorrhoids: Secondary | ICD-10-CM | POA: Diagnosis not present

## 2021-09-02 DIAGNOSIS — K5900 Constipation, unspecified: Secondary | ICD-10-CM | POA: Diagnosis not present

## 2021-09-02 DIAGNOSIS — K649 Unspecified hemorrhoids: Secondary | ICD-10-CM | POA: Diagnosis not present

## 2021-11-24 DIAGNOSIS — Z23 Encounter for immunization: Secondary | ICD-10-CM | POA: Diagnosis not present

## 2021-12-02 DIAGNOSIS — D485 Neoplasm of uncertain behavior of skin: Secondary | ICD-10-CM | POA: Diagnosis not present

## 2021-12-04 DIAGNOSIS — Z1231 Encounter for screening mammogram for malignant neoplasm of breast: Secondary | ICD-10-CM | POA: Diagnosis not present

## 2021-12-23 DIAGNOSIS — J020 Streptococcal pharyngitis: Secondary | ICD-10-CM | POA: Diagnosis not present

## 2022-01-04 DIAGNOSIS — D696 Thrombocytopenia, unspecified: Secondary | ICD-10-CM | POA: Diagnosis not present

## 2022-01-04 DIAGNOSIS — F419 Anxiety disorder, unspecified: Secondary | ICD-10-CM | POA: Diagnosis not present

## 2022-01-04 DIAGNOSIS — E559 Vitamin D deficiency, unspecified: Secondary | ICD-10-CM | POA: Diagnosis not present

## 2022-01-04 DIAGNOSIS — E785 Hyperlipidemia, unspecified: Secondary | ICD-10-CM | POA: Diagnosis not present

## 2022-01-04 DIAGNOSIS — Z139 Encounter for screening, unspecified: Secondary | ICD-10-CM | POA: Diagnosis not present

## 2022-01-04 DIAGNOSIS — M810 Age-related osteoporosis without current pathological fracture: Secondary | ICD-10-CM | POA: Diagnosis not present

## 2022-01-04 DIAGNOSIS — D72819 Decreased white blood cell count, unspecified: Secondary | ICD-10-CM | POA: Diagnosis not present

## 2022-01-04 DIAGNOSIS — K5909 Other constipation: Secondary | ICD-10-CM | POA: Diagnosis not present

## 2022-01-07 DIAGNOSIS — N6324 Unspecified lump in the left breast, lower inner quadrant: Secondary | ICD-10-CM | POA: Diagnosis not present

## 2022-01-07 DIAGNOSIS — N6322 Unspecified lump in the left breast, upper inner quadrant: Secondary | ICD-10-CM | POA: Diagnosis not present

## 2022-01-07 DIAGNOSIS — D531 Other megaloblastic anemias, not elsewhere classified: Secondary | ICD-10-CM | POA: Diagnosis not present

## 2022-01-07 DIAGNOSIS — D696 Thrombocytopenia, unspecified: Secondary | ICD-10-CM | POA: Diagnosis not present

## 2022-01-07 DIAGNOSIS — R9230 Dense breasts, unspecified: Secondary | ICD-10-CM | POA: Diagnosis not present

## 2022-01-13 DIAGNOSIS — N6012 Diffuse cystic mastopathy of left breast: Secondary | ICD-10-CM | POA: Diagnosis not present

## 2022-01-13 DIAGNOSIS — N6322 Unspecified lump in the left breast, upper inner quadrant: Secondary | ICD-10-CM | POA: Diagnosis not present

## 2022-01-13 DIAGNOSIS — N6325 Unspecified lump in the left breast, overlapping quadrants: Secondary | ICD-10-CM | POA: Diagnosis not present

## 2022-01-13 DIAGNOSIS — R928 Other abnormal and inconclusive findings on diagnostic imaging of breast: Secondary | ICD-10-CM | POA: Diagnosis not present

## 2022-01-18 ENCOUNTER — Telehealth: Payer: Self-pay

## 2022-01-18 NOTE — Telephone Encounter (Signed)
Phone contact with pt. Offering to make a surgical referral for pt's breast lesion. Gave her the names of the local surgeons and she will contact me when she makes a decision so that I can schedule her consult.

## 2022-01-18 NOTE — Progress Notes (Signed)
Phone contact with pt. Pt scheduled with Dr. Coralie Keens for surgical consult on 02/17/2021 per pt request.

## 2022-02-17 DIAGNOSIS — N6489 Other specified disorders of breast: Secondary | ICD-10-CM | POA: Insufficient documentation

## 2022-03-15 DIAGNOSIS — M810 Age-related osteoporosis without current pathological fracture: Secondary | ICD-10-CM | POA: Diagnosis not present

## 2022-03-15 DIAGNOSIS — N6489 Other specified disorders of breast: Secondary | ICD-10-CM | POA: Diagnosis not present

## 2022-03-15 DIAGNOSIS — N6324 Unspecified lump in the left breast, lower inner quadrant: Secondary | ICD-10-CM | POA: Diagnosis not present

## 2022-03-15 DIAGNOSIS — I10 Essential (primary) hypertension: Secondary | ICD-10-CM | POA: Diagnosis not present

## 2022-03-15 DIAGNOSIS — Z79899 Other long term (current) drug therapy: Secondary | ICD-10-CM | POA: Diagnosis not present

## 2022-03-15 DIAGNOSIS — N6092 Unspecified benign mammary dysplasia of left breast: Secondary | ICD-10-CM | POA: Diagnosis not present

## 2022-04-05 DIAGNOSIS — H401230 Low-tension glaucoma, bilateral, stage unspecified: Secondary | ICD-10-CM | POA: Diagnosis not present

## 2022-06-01 DIAGNOSIS — D2239 Melanocytic nevi of other parts of face: Secondary | ICD-10-CM | POA: Diagnosis not present

## 2022-06-01 DIAGNOSIS — D225 Melanocytic nevi of trunk: Secondary | ICD-10-CM | POA: Diagnosis not present

## 2022-06-01 DIAGNOSIS — L814 Other melanin hyperpigmentation: Secondary | ICD-10-CM | POA: Diagnosis not present

## 2022-06-01 DIAGNOSIS — L821 Other seborrheic keratosis: Secondary | ICD-10-CM | POA: Diagnosis not present

## 2022-07-06 DIAGNOSIS — E785 Hyperlipidemia, unspecified: Secondary | ICD-10-CM | POA: Diagnosis not present

## 2022-07-06 DIAGNOSIS — M810 Age-related osteoporosis without current pathological fracture: Secondary | ICD-10-CM | POA: Diagnosis not present

## 2022-07-06 DIAGNOSIS — D696 Thrombocytopenia, unspecified: Secondary | ICD-10-CM | POA: Diagnosis not present

## 2022-07-06 DIAGNOSIS — F419 Anxiety disorder, unspecified: Secondary | ICD-10-CM | POA: Diagnosis not present

## 2022-07-06 DIAGNOSIS — E559 Vitamin D deficiency, unspecified: Secondary | ICD-10-CM | POA: Diagnosis not present

## 2022-07-06 DIAGNOSIS — Z9181 History of falling: Secondary | ICD-10-CM | POA: Diagnosis not present

## 2022-07-06 DIAGNOSIS — K5909 Other constipation: Secondary | ICD-10-CM | POA: Diagnosis not present

## 2022-07-06 DIAGNOSIS — Z1331 Encounter for screening for depression: Secondary | ICD-10-CM | POA: Diagnosis not present

## 2022-07-07 DIAGNOSIS — Z961 Presence of intraocular lens: Secondary | ICD-10-CM | POA: Diagnosis not present
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
## 2022-09-24 ENCOUNTER — Ambulatory Visit: Payer: Medicare Other

## 2022-09-24 ENCOUNTER — Ambulatory Visit (INDEPENDENT_AMBULATORY_CARE_PROVIDER_SITE_OTHER): Payer: Medicare Other | Admitting: Podiatry

## 2022-09-24 DIAGNOSIS — M79671 Pain in right foot: Secondary | ICD-10-CM

## 2022-09-24 DIAGNOSIS — M19071 Primary osteoarthritis, right ankle and foot: Secondary | ICD-10-CM | POA: Diagnosis not present

## 2022-09-24 NOTE — Progress Notes (Unsigned)
    Chief Complaint  Patient presents with   Foot Pain    RIGHT FOOT PAIN AT BALL OF FOOT, PAINFUL WHEN WALKING, LONGER SHE WALKS THE WORSE IT IS, AND SHE LIKE TO BE ACTIVE, PAIN LEVEL 8/10, BEEN HURTING FOR 2 MONTHS, HAD AN INJECTION 3 YEARS AGO FOR THE SAME ISSUE.    HPI: 67 y.o. female presents today with concern of pain in the ball of the right foot.  States that she was seen in the past and had the same pain before.  She did well with cortisone injection.  She is requesting another cortisone injection today.  States that it left her pain-free for 3 years.  History reviewed. No pertinent past medical history.  History reviewed. No pertinent surgical history.  Allergies  Allergen Reactions   Celecoxib Nausea Only    UPSETS STOMACH   Nisoldipine Other (See Comments)    unknown   Piroxicam Other (See Comments)    TONGUE SWELLS   Rofecoxib Rash     Physical Exam: There were no vitals filed for this visit.  General: The patient is alert and oriented x3 in no acute distress.  Dermatology: Skin is warm, dry and supple bilateral lower extremities. Interspaces are clear of maceration and debris.    Vascular: Palpable pedal pulses bilaterally. Capillary refill within normal limits.  No appreciable edema.  No erythema or calor.  Neurological: Light touch sensation grossly intact bilateral feet.   Musculoskeletal Exam: Pain on palpation plantar aspect of third metatarsal phalangeal joint/capsule.  Pain with forced dorsiflexion of the third MPJ.  Mild pain with compression of the third interspace as well.  Negative Mulder sign  Radiographic Exam (right foot, 3 weightbearing views, 09/24/2022):  Normal osseous mineralization.  Some joint space narrowing seen at the third MPJ.  No fracture noted.  No erosive findings are seen.  Assessment/Plan of Care: 1. Right foot pain   2. Osteoarthritis of right ankle and foot     DG FOOT COMPLETE RIGHT  Discussed clinical and radiographic  findings with patient today.  With the patient's consent, and after a sterile skin prep, a cortisone injection was administered to the right third MPJ with some residual cortisone administered to the third interspace since she also was having pain in that area.  She tolerated the injection well.  A Band-Aid was applied.  Will have patient follow-up as needed.  A metatarsal pad was applied to her right shoe insole to try to offload the third metatarsal head.  Also discussed shoes with a rocker-bottom, or carbon plate, to decrease flexion/extension at the MPJ to try to decrease chance of recurrence.   Clerance Lav, DPM, FACFAS [facility]     [address] [place], [place] [address]                Office [phone]  Fax [phone]

## 2022-09-26 ENCOUNTER — Encounter: Payer: Self-pay | Admitting: Podiatry

## 2022-11-05 DIAGNOSIS — Z23 Encounter for immunization: Secondary | ICD-10-CM | POA: Diagnosis not present

## 2022-12-13 DIAGNOSIS — H401230 Low-tension glaucoma, bilateral, stage unspecified: Secondary | ICD-10-CM | POA: Diagnosis not present

## 2022-12-20 DIAGNOSIS — R091 Pleurisy: Secondary | ICD-10-CM | POA: Diagnosis not present

## 2022-12-20 DIAGNOSIS — R0781 Pleurodynia: Secondary | ICD-10-CM | POA: Diagnosis not present

## 2023-01-10 DIAGNOSIS — E559 Vitamin D deficiency, unspecified: Secondary | ICD-10-CM | POA: Diagnosis not present

## 2023-01-10 DIAGNOSIS — K5909 Other constipation: Secondary | ICD-10-CM | POA: Diagnosis not present

## 2023-01-10 DIAGNOSIS — F419 Anxiety disorder, unspecified: Secondary | ICD-10-CM | POA: Diagnosis not present

## 2023-01-10 DIAGNOSIS — M810 Age-related osteoporosis without current pathological fracture: Secondary | ICD-10-CM | POA: Diagnosis not present

## 2023-01-10 DIAGNOSIS — E785 Hyperlipidemia, unspecified: Secondary | ICD-10-CM | POA: Diagnosis not present

## 2023-01-10 DIAGNOSIS — D696 Thrombocytopenia, unspecified: Secondary | ICD-10-CM | POA: Diagnosis not present

## 2023-01-31 DIAGNOSIS — Z1231 Encounter for screening mammogram for malignant neoplasm of breast: Secondary | ICD-10-CM | POA: Diagnosis not present

## 2023-02-17 DIAGNOSIS — Z9181 History of falling: Secondary | ICD-10-CM | POA: Diagnosis not present

## 2023-02-17 DIAGNOSIS — Z0000 Encounter for general adult medical examination without abnormal findings: Secondary | ICD-10-CM | POA: Diagnosis not present

## 2023-02-22 DIAGNOSIS — Z1231 Encounter for screening mammogram for malignant neoplasm of breast: Secondary | ICD-10-CM | POA: Diagnosis not present

## 2023-03-07 DIAGNOSIS — Z01419 Encounter for gynecological examination (general) (routine) without abnormal findings: Secondary | ICD-10-CM | POA: Diagnosis not present

## 2023-03-10 DIAGNOSIS — N6489 Other specified disorders of breast: Secondary | ICD-10-CM | POA: Diagnosis not present

## 2023-06-02 DIAGNOSIS — D2239 Melanocytic nevi of other parts of face: Secondary | ICD-10-CM | POA: Diagnosis not present

## 2023-06-02 DIAGNOSIS — D225 Melanocytic nevi of trunk: Secondary | ICD-10-CM | POA: Diagnosis not present

## 2023-06-02 DIAGNOSIS — Z85820 Personal history of malignant melanoma of skin: Secondary | ICD-10-CM | POA: Diagnosis not present

## 2023-06-02 DIAGNOSIS — L821 Other seborrheic keratosis: Secondary | ICD-10-CM | POA: Diagnosis not present

## 2023-07-11 DIAGNOSIS — E559 Vitamin D deficiency, unspecified: Secondary | ICD-10-CM | POA: Diagnosis not present

## 2023-07-11 DIAGNOSIS — E785 Hyperlipidemia, unspecified: Secondary | ICD-10-CM | POA: Diagnosis not present

## 2023-07-11 DIAGNOSIS — D696 Thrombocytopenia, unspecified: Secondary | ICD-10-CM | POA: Diagnosis not present

## 2023-07-11 DIAGNOSIS — D649 Anemia, unspecified: Secondary | ICD-10-CM | POA: Diagnosis not present

## 2023-07-11 DIAGNOSIS — F419 Anxiety disorder, unspecified: Secondary | ICD-10-CM | POA: Diagnosis not present

## 2023-07-11 DIAGNOSIS — M810 Age-related osteoporosis without current pathological fracture: Secondary | ICD-10-CM | POA: Diagnosis not present

## 2023-07-11 DIAGNOSIS — D531 Other megaloblastic anemias, not elsewhere classified: Secondary | ICD-10-CM | POA: Diagnosis not present

## 2023-07-11 DIAGNOSIS — K5909 Other constipation: Secondary | ICD-10-CM | POA: Diagnosis not present

## 2023-07-26 DIAGNOSIS — Z961 Presence of intraocular lens: Secondary | ICD-10-CM | POA: Diagnosis not present

## 2023-07-27 ENCOUNTER — Other Ambulatory Visit: Payer: Self-pay | Admitting: Oncology

## 2023-07-27 DIAGNOSIS — D508 Other iron deficiency anemias: Secondary | ICD-10-CM

## 2023-07-27 NOTE — Progress Notes (Signed)
 [facility]  [address] [place],  [place]  [address] [phone]  Clinic Day:  07/28/2023  Referring physician: Keren Vicenta BRAVO, MD   HISTORY OF PRESENT ILLNESS:  The patient is a 68 y.o. female  who I was asked to consult upon for iron deficiency anemia.  Recent labs showed a low hemoglobin of 9.5, with a low MCV of 77.  Iron studies done recently showed a low serum iron of 20, a TIBC of 371, and a low iron saturation of 5%.  Of note, her B12 and folate levels were normal at 597 and 7.4, respectively.  According to the patient, she had never been told before of being anemic.  She denies having any overt forms of blood loss to explain her iron deficiency anemia.  Of note, this patient did undergo a colonoscopy in May 2023, which showed only pedunculated internal hemorrhoids.  No other adverse lower GI tract pathology was seen.  This patient denies having undergone previous stomach surgery.  To her knowledge, there is no family history of anemia or other hematologic disorders.  PAST MEDICAL HISTORY:   Past Medical History:  Diagnosis Date   Anemia    Anxiety    Arthritis    History of colon polyps    History of constipation    History of cyst of breast    RIGHT LUMPECTOMY   History of diverticulitis    History of hemorrhoids    History of melanoma    RIGHT FOREARM   Hyperlipidemia    Osteoporosis    Thrombocytopenia (HCC)    Vitamin D deficiency     PAST SURGICAL HISTORY:   Past Surgical History:  Procedure Laterality Date   BREAST BIOPSY Bilateral    MULTIPLE   BREAST LUMPECTOMY Left    CATARACT EXTRACTION Bilateral    COLONOSCOPY W/ POLYPECTOMY     KNEE SURGERY Left    PARTIAL HYSTERECTOMY      CURRENT MEDICATIONS:   Current Outpatient Medications  Medication Sig Dispense Refill   ALPRAZolam (XANAX) 0.25 MG tablet Take 0.25 mg by mouth 3 (three) times daily as needed.     ALPRAZolam (XANAX) 0.25 MG tablet Take 0.25 mg by  mouth 3 (three) times daily as needed for anxiety.     AMITIZA 8 MCG capsule Take 8 mcg by mouth 2 (two) times daily.     AMITIZA 8 MCG capsule Take 8 mcg by mouth 2 (two) times daily.     citalopram (CELEXA) 20 MG tablet Take 20 mg by mouth daily.     citalopram (CELEXA) 20 MG tablet Take 20 mg by mouth daily.     HYDROcodone-acetaminophen  (NORCO/VICODIN) 5-325 MG tablet      ondansetron  (ZOFRAN  ODT) 4 MG disintegrating tablet Take 1 tablet (4 mg total) by mouth every 8 (eight) hours as needed for nausea or vomiting. 20 tablet 0   predniSONE  (DELTASONE ) 20 MG tablet Take 2 tablets (40 mg total) by mouth daily. 10 tablet 0   predniSONE  (STERAPRED UNI-PAK 21 TAB) 10 MG (21) TBPK tablet Take as directed 21 tablet 0   PREMARIN 0.3 MG tablet Take 0.3 mg by mouth daily.     PREMARIN 0.3 MG tablet Take 0.3 mg by mouth daily.     PREMARIN vaginal cream      simvastatin (ZOCOR) 20 MG tablet Take 20 mg by mouth at bedtime.     simvastatin (ZOCOR) 20 MG tablet Take 20 mg by mouth at bedtime.  traMADol (ULTRAM) 50 MG tablet      No current facility-administered medications for this visit.    ALLERGIES:   Allergies  Allergen Reactions   Celecoxib Nausea Only    UPSETS STOMACH   Nisoldipine Other (See Comments)    unknown   Piroxicam Other (See Comments)    TONGUE SWELLS   Rofecoxib Rash    FAMILY HISTORY:   Family History  Problem Relation Age of Onset   Heart failure Mother    Heart failure Father    Lung cancer Father    AAA (abdominal aortic aneurysm) Father    Breast cancer Sister     SOCIAL HISTORY:  The patient was born and raised in [place].  She currently lives in [place] with her husband of 43 years.  They have 1 child.  She previously did retail work for 20 years.  There is no history of alcoholism or tobacco abuse.  REVIEW OF SYSTEMS:  Review of Systems  Constitutional:  Positive for fatigue. Negative for fever.  HENT:   Negative for hearing loss and sore  throat.   Eyes:  Positive for eye problems.  Respiratory:  Negative for chest tightness, cough and hemoptysis.   Cardiovascular:  Negative for chest pain and palpitations.  Gastrointestinal:  Positive for constipation. Negative for abdominal distention, abdominal pain, blood in stool, diarrhea, nausea and vomiting.  Endocrine: Negative for hot flashes.  Genitourinary:  Negative for difficulty urinating, dysuria, frequency, hematuria and nocturia.   Musculoskeletal:  Positive for back pain and neck pain. Negative for arthralgias, gait problem and myalgias.  Skin: Negative.  Negative for itching and rash.  Neurological: Negative.  Negative for dizziness, extremity weakness, gait problem, headaches, light-headedness and numbness.  Hematological: Negative.   Psychiatric/Behavioral:  Negative for depression and suicidal ideas. The patient is nervous/anxious.     PHYSICAL EXAM:  Blood pressure 112/72, pulse 78, temperature 98.5 F (36.9 C), temperature source Oral, resp. rate 14, height 5' 3 (1.6 m), weight 93 lb 14.4 oz (42.6 kg), SpO2 100%. Wt Readings from Last 3 Encounters:  07/28/23 93 lb 14.4 oz (42.6 kg)   Body mass index is 16.63 kg/m. Performance status (ECOG): 0 - Asymptomatic Physical Exam Constitutional:      Appearance: Normal appearance. She is not ill-appearing.     Comments: A pleasant, thin woman in no acute distress  HENT:     Mouth/Throat:     Mouth: Mucous membranes are moist.     Pharynx: Oropharynx is clear. No oropharyngeal exudate or posterior oropharyngeal erythema.   Cardiovascular:     Rate and Rhythm: Normal rate and regular rhythm.     Heart sounds: No murmur heard.    No friction rub. No gallop.  Pulmonary:     Effort: Pulmonary effort is normal. No respiratory distress.     Breath sounds: Normal breath sounds. No wheezing, rhonchi or rales.  Abdominal:     General: Bowel sounds are normal. There is no distension.     Palpations: Abdomen is soft. There  is no mass.     Tenderness: There is no abdominal tenderness.   Musculoskeletal:        General: No swelling.     Right lower leg: No edema.     Left lower leg: No edema.  Lymphadenopathy:     Cervical: No cervical adenopathy.     Upper Body:     Right upper body: No supraclavicular or axillary adenopathy.     Left upper  body: No supraclavicular or axillary adenopathy.     Lower Body: No right inguinal adenopathy. No left inguinal adenopathy.   Skin:    General: Skin is warm.     Coloration: Skin is not jaundiced.     Findings: No lesion or rash.   Neurological:     General: No focal deficit present.     Mental Status: She is alert and oriented to person, place, and time. Mental status is at baseline.   Psychiatric:        Mood and Affect: Mood normal.        Behavior: Behavior normal.        Thought Content: Thought content normal.   LABS:      Latest Ref Rng & Units 07/28/2023    1:35 PM 03/12/2020   12:43 PM  CBC  WBC 4.0 - 10.5 K/uL 3.4  2.5   Hemoglobin 12.0 - 15.0 g/dL 9.5  87.7   Hematocrit 36.0 - 46.0 % 33.2  37.7   Platelets 150 - 400 K/uL 185  85       Latest Ref Rng & Units 03/12/2020   12:43 PM  CMP  Glucose 70 - 99 mg/dL 99   BUN 8 - 23 mg/dL 10   Creatinine 9.55 - 1.00 mg/dL 9.44   Sodium 864 - 854 mmol/L 134   Potassium 3.5 - 5.1 mmol/L 3.3   Chloride 98 - 111 mmol/L 98   CO2 22 - 32 mmol/L 24   Calcium 8.9 - 10.3 mg/dL 8.6   Total Protein 6.5 - 8.1 g/dL 5.9   Total Bilirubin 0.3 - 1.2 mg/dL 0.8   Alkaline Phos 38 - 126 U/L 44   AST 15 - 41 U/L 33   ALT 0 - 44 U/L 18     Latest Reference Range & Units 07/28/23 13:34  Iron 28 - 170 ug/dL 17 (L)  UIBC ug/dL 578  TIBC 749 - 549 ug/dL 561  Saturation Ratios 10.4 - 31.8 % 4 (L)  Ferritin 11 - 307 ng/mL 4 (L)  (L): Data is abnormally low  ASSESSMENT & PLAN:  A 68 y.o. female who I was asked to consult upon for iron deficiency anemia.  I will arrange for her to receive IV iron over these next few  weeks to rapidly replenish her iron stores and normalize her hemoglobin.  As there has been a fairly significant decline in her hemoglobin in just the past 3 years, I do want the patient to undergo Cologuard testing under the guidance of her primary care office.  If this test result comes back abnormal, I do believe that it would warrant a repeat GI workup, even though her colonoscopy was essentially unremarkable 2 years ago.  Otherwise, I will see her back in 3 months to reassess her iron and hemoglobin levels to see how well she responded to her upcoming IV iron.  The patient understands all the plans discussed today and is in agreement with them.  I do appreciate Keren Vicenta BRAVO, MD for his new consult.   Chasen Mendell DELENA Kerns, MD

## 2023-07-28 ENCOUNTER — Inpatient Hospital Stay: Attending: Oncology | Admitting: Oncology

## 2023-07-28 ENCOUNTER — Telehealth: Payer: Self-pay | Admitting: Oncology

## 2023-07-28 ENCOUNTER — Inpatient Hospital Stay

## 2023-07-28 ENCOUNTER — Encounter: Payer: Self-pay | Admitting: Oncology

## 2023-07-28 VITALS — BP 112/72 | HR 78 | Temp 98.5°F | Resp 14 | Ht 63.0 in | Wt 93.9 lb

## 2023-07-28 DIAGNOSIS — Z7952 Long term (current) use of systemic steroids: Secondary | ICD-10-CM | POA: Diagnosis not present

## 2023-07-28 DIAGNOSIS — E559 Vitamin D deficiency, unspecified: Secondary | ICD-10-CM | POA: Insufficient documentation

## 2023-07-28 DIAGNOSIS — D509 Iron deficiency anemia, unspecified: Secondary | ICD-10-CM | POA: Insufficient documentation

## 2023-07-28 DIAGNOSIS — Z803 Family history of malignant neoplasm of breast: Secondary | ICD-10-CM | POA: Insufficient documentation

## 2023-07-28 DIAGNOSIS — M810 Age-related osteoporosis without current pathological fracture: Secondary | ICD-10-CM | POA: Diagnosis not present

## 2023-07-28 DIAGNOSIS — Z79899 Other long term (current) drug therapy: Secondary | ICD-10-CM | POA: Insufficient documentation

## 2023-07-28 DIAGNOSIS — Z85820 Personal history of malignant melanoma of skin: Secondary | ICD-10-CM | POA: Diagnosis not present

## 2023-07-28 DIAGNOSIS — D696 Thrombocytopenia, unspecified: Secondary | ICD-10-CM | POA: Diagnosis not present

## 2023-07-28 DIAGNOSIS — Z860100 Personal history of colon polyps, unspecified: Secondary | ICD-10-CM | POA: Diagnosis not present

## 2023-07-28 DIAGNOSIS — M129 Arthropathy, unspecified: Secondary | ICD-10-CM | POA: Diagnosis not present

## 2023-07-28 DIAGNOSIS — D508 Other iron deficiency anemias: Secondary | ICD-10-CM | POA: Diagnosis not present

## 2023-07-28 DIAGNOSIS — Z801 Family history of malignant neoplasm of trachea, bronchus and lung: Secondary | ICD-10-CM | POA: Diagnosis not present

## 2023-07-28 DIAGNOSIS — E785 Hyperlipidemia, unspecified: Secondary | ICD-10-CM | POA: Diagnosis not present

## 2023-07-28 LAB — CBC WITH DIFFERENTIAL (CANCER CENTER ONLY)
Abs Immature Granulocytes: 0.01 10*3/uL (ref 0.00–0.07)
Basophils Absolute: 0 10*3/uL (ref 0.0–0.1)
Basophils Relative: 1 %
Eosinophils Absolute: 0.1 10*3/uL (ref 0.0–0.5)
Eosinophils Relative: 2 %
HCT: 33.2 % — ABNORMAL LOW (ref 36.0–46.0)
Hemoglobin: 9.5 g/dL — ABNORMAL LOW (ref 12.0–15.0)
Immature Granulocytes: 0 %
Lymphocytes Relative: 24 %
Lymphs Abs: 0.8 10*3/uL (ref 0.7–4.0)
MCH: 21.6 pg — ABNORMAL LOW (ref 26.0–34.0)
MCHC: 28.6 g/dL — ABNORMAL LOW (ref 30.0–36.0)
MCV: 75.6 fL — ABNORMAL LOW (ref 80.0–100.0)
Monocytes Absolute: 0.3 10*3/uL (ref 0.1–1.0)
Monocytes Relative: 9 %
Neutro Abs: 2.2 10*3/uL (ref 1.7–7.7)
Neutrophils Relative %: 64 %
Platelet Count: 185 10*3/uL (ref 150–400)
RBC: 4.39 MIL/uL (ref 3.87–5.11)
RDW: 19.5 % — ABNORMAL HIGH (ref 11.5–15.5)
WBC Count: 3.4 10*3/uL — ABNORMAL LOW (ref 4.0–10.5)
nRBC: 0 % (ref 0.0–0.2)

## 2023-07-28 LAB — IRON AND TIBC
Iron: 17 ug/dL — ABNORMAL LOW (ref 28–170)
Saturation Ratios: 4 % — ABNORMAL LOW (ref 10.4–31.8)
TIBC: 438 ug/dL (ref 250–450)
UIBC: 421 ug/dL

## 2023-07-28 LAB — FERRITIN: Ferritin: 4 ng/mL — ABNORMAL LOW (ref 11–307)

## 2023-07-28 NOTE — Telephone Encounter (Signed)
 Patient has been scheduled for follow-up visit per 07/27/23 LOS.  Pt aware of scheduled appt details.

## 2023-07-29 DIAGNOSIS — D509 Iron deficiency anemia, unspecified: Secondary | ICD-10-CM | POA: Insufficient documentation

## 2023-08-15 ENCOUNTER — Encounter: Payer: Self-pay | Admitting: Oncology

## 2023-08-15 ENCOUNTER — Telehealth: Payer: Self-pay

## 2023-08-15 ENCOUNTER — Telehealth: Payer: Self-pay | Admitting: Oncology

## 2023-08-15 NOTE — Telephone Encounter (Signed)
 Patient has been scheduled. Aware of appt date and time.    Scheduling Message Entered by [place], AMY W on 08/15/2023 at  9:55 AM Priority: Routine INFUSION 1HR30MIN (90)  Department: [facility]  Provider: Ezzard Valaria LABOR, MD  Appointment Notes:  Needs IV iron per Ezzard

## 2023-08-15 NOTE — Telephone Encounter (Signed)
  Latest Reference Range & Units 07/28/23 13:34   Iron 28 - 170 ug/dL 17 (L)  UIBC ug/dL 578  TIBC 749 - 549 ug/dL 561  Saturation Ratios 10.4 - 31.8 % 4 (L)  Ferritin 11 - 307 ng/mL 4 (L)  (L): Data is abnormally low   ASSESSMENT & PLAN:  A 68 y.o. female who I was asked to consult upon for iron deficiency anemia.  I will arrange for her to receive IV iron over these next few weeks to rapidly replenish her iron stores and normalize her hemoglobin.  As there has been a fairly significant decline in her hemoglobin in just the past 3 years, I do want the patient to undergo Cologuard testing under the guidance of her primary care office.  If this test result comes back abnormal, I do believe that it would warrant a repeat GI workup, even though her colonoscopy was essentially unremarkable 2 years ago.  Otherwise, I will see her back in 3 months to reassess her iron and hemoglobin levels to see how well she responded to her upcoming IV iron.  The patient understands all the plans discussed today and is in agreement with them.

## 2023-08-17 ENCOUNTER — Inpatient Hospital Stay: Attending: Oncology

## 2023-08-17 VITALS — BP 101/66 | HR 81 | Temp 97.9°F | Resp 16

## 2023-08-17 DIAGNOSIS — D509 Iron deficiency anemia, unspecified: Secondary | ICD-10-CM | POA: Insufficient documentation

## 2023-08-17 MED ORDER — SODIUM CHLORIDE 0.9 % IV SOLN
1000.0000 mg | Freq: Once | INTRAVENOUS | Status: AC
  Administered 2023-08-17: 1000 mg via INTRAVENOUS
  Filled 2023-08-17: qty 10

## 2023-08-17 MED ORDER — ACETAMINOPHEN 325 MG PO TABS
650.0000 mg | ORAL_TABLET | Freq: Once | ORAL | Status: AC
  Administered 2023-08-17: 650 mg via ORAL
  Filled 2023-08-17: qty 2

## 2023-08-17 MED ORDER — SODIUM CHLORIDE 0.9 % IV SOLN
INTRAVENOUS | Status: DC
Start: 2023-08-17 — End: 2023-08-17

## 2023-08-17 MED ORDER — LORATADINE 10 MG PO TABS
10.0000 mg | ORAL_TABLET | Freq: Once | ORAL | Status: AC
  Administered 2023-08-17: 10 mg via ORAL
  Filled 2023-08-17: qty 1

## 2023-08-17 NOTE — Patient Instructions (Signed)

## 2023-09-15 DIAGNOSIS — H35373 Puckering of macula, bilateral: Secondary | ICD-10-CM | POA: Diagnosis not present

## 2023-09-19 DIAGNOSIS — Z1211 Encounter for screening for malignant neoplasm of colon: Secondary | ICD-10-CM | POA: Diagnosis not present

## 2023-09-19 DIAGNOSIS — Z1212 Encounter for screening for malignant neoplasm of rectum: Secondary | ICD-10-CM | POA: Diagnosis not present

## 2023-10-27 ENCOUNTER — Other Ambulatory Visit: Payer: Self-pay

## 2023-10-28 ENCOUNTER — Other Ambulatory Visit

## 2023-10-28 ENCOUNTER — Ambulatory Visit: Admitting: Oncology

## 2023-10-30 NOTE — Progress Notes (Unsigned)
 [facility]  [address] [place],  [place]  [address] [phone]  Clinic Day:  10/30/2023  Referring physician: Keren Vicenta BRAVO, MD   HISTORY OF PRESENT ILLNESS:  The patient is a 68 y.o. female  who I recently began seeing for iron deficiency anemia.  She comes in today to reassess her iron and hemoglobin levels after receiving IV iron in July 2025.  recent labs showed a low hemoglobin of 9.5, with a low MCV of 77.  Iron studies done recently showed a low serum iron of 20, a TIBC of 371, and a low iron saturation of 5%.  Of note, her B12 and folate levels were normal at 597 and 7.4, respectively.  According to the patient, she had never been told before of being anemic.  She denies having any overt forms of blood loss to explain her iron deficiency anemia.  Of note, this patient did undergo a colonoscopy in May 2023, which showed only pedunculated internal hemorrhoids.  No other adverse lower GI tract pathology was seen.  This patient denies having undergone previous stomach surgery.  To her knowledge, there is no family history of anemia or other hematologic disorders.  PAST MEDICAL HISTORY:   Past Medical History:  Diagnosis Date   Anemia    Anxiety    Arthritis    History of colon polyps    History of constipation    History of cyst of breast    RIGHT LUMPECTOMY   History of diverticulitis    History of hemorrhoids    History of melanoma    RIGHT FOREARM   Hyperlipidemia    Osteoporosis    Thrombocytopenia    Vitamin D deficiency     PAST SURGICAL HISTORY:   Past Surgical History:  Procedure Laterality Date   BREAST BIOPSY Bilateral    MULTIPLE   BREAST LUMPECTOMY Left    CATARACT EXTRACTION Bilateral    COLONOSCOPY W/ POLYPECTOMY     KNEE SURGERY Left    PARTIAL HYSTERECTOMY      CURRENT MEDICATIONS:   Current Outpatient Medications  Medication Sig Dispense Refill   estradiol (ESTRACE) 0.5 MG tablet      latanoprost  (XALATAN) 0.005 % ophthalmic solution      Vitamin D, Ergocalciferol, (DRISDOL) 1.25 MG (50000 UNIT) CAPS capsule Take 50,000 Units by mouth once a week.     ALPRAZolam (XANAX) 0.25 MG tablet Take 0.25 mg by mouth 3 (three) times daily as needed.     ALPRAZolam (XANAX) 0.25 MG tablet Take 0.25 mg by mouth 3 (three) times daily as needed for anxiety.     AMITIZA 8 MCG capsule Take 8 mcg by mouth 2 (two) times daily.     AMITIZA 8 MCG capsule Take 8 mcg by mouth 2 (two) times daily.     citalopram (CELEXA) 20 MG tablet Take 20 mg by mouth daily.     citalopram (CELEXA) 20 MG tablet Take 20 mg by mouth daily.     HYDROcodone-acetaminophen  (NORCO/VICODIN) 5-325 MG tablet      ondansetron  (ZOFRAN  ODT) 4 MG disintegrating tablet Take 1 tablet (4 mg total) by mouth every 8 (eight) hours as needed for nausea or vomiting. 20 tablet 0   predniSONE  (DELTASONE ) 20 MG tablet Take 2 tablets (40 mg total) by mouth daily. 10 tablet 0   predniSONE  (STERAPRED UNI-PAK 21 TAB) 10 MG (21) TBPK tablet Take as directed 21 tablet 0   simvastatin (ZOCOR) 20 MG tablet  Take 20 mg by mouth at bedtime.     simvastatin (ZOCOR) 20 MG tablet Take 20 mg by mouth at bedtime.     traMADol (ULTRAM) 50 MG tablet      No current facility-administered medications for this visit.    ALLERGIES:   Allergies  Allergen Reactions   Celecoxib Nausea Only    UPSETS STOMACH   Nisoldipine Other (See Comments)    unknown   Piroxicam Other (See Comments)    TONGUE SWELLS   Rofecoxib Rash    FAMILY HISTORY:   Family History  Problem Relation Age of Onset   Heart failure Mother    Heart failure Father    Lung cancer Father    AAA (abdominal aortic aneurysm) Father    Breast cancer Sister     SOCIAL HISTORY:  The patient was born and raised in [place].  She currently lives in [place] with her husband of 43 years.  They have 1 child.  She previously did retail work for 20 years.  There is no history of alcoholism or  tobacco abuse.  REVIEW OF SYSTEMS:  Review of Systems  Constitutional:  Positive for fatigue. Negative for fever.  HENT:   Negative for hearing loss and sore throat.   Eyes:  Positive for eye problems.  Respiratory:  Negative for chest tightness, cough and hemoptysis.   Cardiovascular:  Negative for chest pain and palpitations.  Gastrointestinal:  Positive for constipation. Negative for abdominal distention, abdominal pain, blood in stool, diarrhea, nausea and vomiting.  Endocrine: Negative for hot flashes.  Genitourinary:  Negative for difficulty urinating, dysuria, frequency, hematuria and nocturia.   Musculoskeletal:  Positive for back pain and neck pain. Negative for arthralgias, gait problem and myalgias.  Skin: Negative.  Negative for itching and rash.  Neurological: Negative.  Negative for dizziness, extremity weakness, gait problem, headaches, light-headedness and numbness.  Hematological: Negative.   Psychiatric/Behavioral:  Negative for depression and suicidal ideas. The patient is nervous/anxious.     PHYSICAL EXAM:  There were no vitals taken for this visit. Wt Readings from Last 3 Encounters:  07/28/23 93 lb 14.4 oz (42.6 kg)   There is no height or weight on file to calculate BMI. Performance status (ECOG): 0 - Asymptomatic Physical Exam Constitutional:      Appearance: Normal appearance. She is not ill-appearing.     Comments: A pleasant, thin woman in no acute distress  HENT:     Mouth/Throat:     Mouth: Mucous membranes are moist.     Pharynx: Oropharynx is clear. No oropharyngeal exudate or posterior oropharyngeal erythema.  Cardiovascular:     Rate and Rhythm: Normal rate and regular rhythm.     Heart sounds: No murmur heard.    No friction rub. No gallop.  Pulmonary:     Effort: Pulmonary effort is normal. No respiratory distress.     Breath sounds: Normal breath sounds. No wheezing, rhonchi or rales.  Abdominal:     General: Bowel sounds are normal. There  is no distension.     Palpations: Abdomen is soft. There is no mass.     Tenderness: There is no abdominal tenderness.  Musculoskeletal:        General: No swelling.     Right lower leg: No edema.     Left lower leg: No edema.  Lymphadenopathy:     Cervical: No cervical adenopathy.     Upper Body:     Right upper body: No supraclavicular or  axillary adenopathy.     Left upper body: No supraclavicular or axillary adenopathy.     Lower Body: No right inguinal adenopathy. No left inguinal adenopathy.  Skin:    General: Skin is warm.     Coloration: Skin is not jaundiced.     Findings: No lesion or rash.  Neurological:     General: No focal deficit present.     Mental Status: She is alert and oriented to person, place, and time. Mental status is at baseline.  Psychiatric:        Mood and Affect: Mood normal.        Behavior: Behavior normal.        Thought Content: Thought content normal.    LABS:      Latest Ref Rng & Units 07/28/2023    1:35 PM 03/12/2020   12:43 PM  CBC  WBC 4.0 - 10.5 K/uL 3.4  2.5   Hemoglobin 12.0 - 15.0 g/dL 9.5  87.7   Hematocrit 36.0 - 46.0 % 33.2  37.7   Platelets 150 - 400 K/uL 185  85       Latest Ref Rng & Units 03/12/2020   12:43 PM  CMP  Glucose 70 - 99 mg/dL 99   BUN 8 - 23 mg/dL 10   Creatinine 9.55 - 1.00 mg/dL 9.44   Sodium 864 - 854 mmol/L 134   Potassium 3.5 - 5.1 mmol/L 3.3   Chloride 98 - 111 mmol/L 98   CO2 22 - 32 mmol/L 24   Calcium 8.9 - 10.3 mg/dL 8.6   Total Protein 6.5 - 8.1 g/dL 5.9   Total Bilirubin 0.3 - 1.2 mg/dL 0.8   Alkaline Phos 38 - 126 U/L 44   AST 15 - 41 U/L 33   ALT 0 - 44 U/L 18     Latest Reference Range & Units 07/28/23 13:34  Iron 28 - 170 ug/dL 17 (L)  UIBC ug/dL 578  TIBC 749 - 549 ug/dL 561  Saturation Ratios 10.4 - 31.8 % 4 (L)  Ferritin 11 - 307 ng/mL 4 (L)  (L): Data is abnormally low  ASSESSMENT & PLAN:  A 68 y.o. female who I was asked to consult upon for iron deficiency anemia.  I will  arrange for her to receive IV iron over these next few weeks to rapidly replenish her iron stores and normalize her hemoglobin.  As there has been a fairly significant decline in her hemoglobin in just the past 3 years, I do want the patient to undergo Cologuard testing under the guidance of her primary care office.  If this test result comes back abnormal, I do believe that it would warrant a repeat GI workup, even though her colonoscopy was essentially unremarkable 2 years ago.  Otherwise, I will see her back in 3 months to reassess her iron and hemoglobin levels to see how well she responded to her upcoming IV iron.  The patient understands all the plans discussed today and is in agreement with them.  I do appreciate Keren Vicenta BRAVO, MD for his new consult.   Taccara Bushnell DELENA Kerns, MD

## 2023-10-31 ENCOUNTER — Other Ambulatory Visit: Payer: Self-pay

## 2023-10-31 ENCOUNTER — Telehealth: Payer: Self-pay | Admitting: Oncology

## 2023-10-31 ENCOUNTER — Inpatient Hospital Stay: Attending: Oncology

## 2023-10-31 ENCOUNTER — Telehealth: Payer: Self-pay

## 2023-10-31 ENCOUNTER — Inpatient Hospital Stay: Admitting: Oncology

## 2023-10-31 ENCOUNTER — Other Ambulatory Visit: Payer: Self-pay | Admitting: Oncology

## 2023-10-31 VITALS — BP 116/81 | HR 63 | Temp 98.2°F | Resp 14 | Ht 63.0 in | Wt 95.3 lb

## 2023-10-31 DIAGNOSIS — D509 Iron deficiency anemia, unspecified: Secondary | ICD-10-CM

## 2023-10-31 DIAGNOSIS — D508 Other iron deficiency anemias: Secondary | ICD-10-CM

## 2023-10-31 LAB — CMP (CANCER CENTER ONLY)
ALT: 20 U/L (ref 0–44)
AST: 29 U/L (ref 15–41)
Albumin: 4.6 g/dL (ref 3.5–5.0)
Alkaline Phosphatase: 82 U/L (ref 38–126)
Anion gap: 12 (ref 5–15)
BUN: 15 mg/dL (ref 8–23)
CO2: 25 mmol/L (ref 22–32)
Calcium: 9.7 mg/dL (ref 8.9–10.3)
Chloride: 100 mmol/L (ref 98–111)
Creatinine: 0.68 mg/dL (ref 0.44–1.00)
GFR, Estimated: >60 mL/min (ref >60)
Glucose, Bld: 79 mg/dL (ref 70–99)
Potassium: 4.5 mmol/L (ref 3.5–5.1)
Sodium: 137 mmol/L (ref 135–145)
Total Bilirubin: 0.4 mg/dL (ref 0.0–1.2)
Total Protein: 7.1 g/dL (ref 6.5–8.1)

## 2023-10-31 LAB — CBC WITH DIFFERENTIAL (CANCER CENTER ONLY)
Abs Immature Granulocytes: 0.01 K/uL (ref 0.00–0.07)
Basophils Absolute: 0.1 K/uL (ref 0.0–0.1)
Basophils Relative: 2 %
Eosinophils Absolute: 0.1 K/uL (ref 0.0–0.5)
Eosinophils Relative: 2 %
HCT: 45.2 % (ref 36.0–46.0)
Hemoglobin: 14.9 g/dL (ref 12.0–15.0)
Immature Granulocytes: 0 %
Lymphocytes Relative: 27 %
Lymphs Abs: 1 K/uL (ref 0.7–4.0)
MCH: 30.5 pg (ref 26.0–34.0)
MCHC: 33 g/dL (ref 30.0–36.0)
MCV: 92.4 fL (ref 80.0–100.0)
Monocytes Absolute: 0.4 K/uL (ref 0.1–1.0)
Monocytes Relative: 10 %
Neutro Abs: 2.4 K/uL (ref 1.7–7.7)
Neutrophils Relative %: 59 %
Platelet Count: 126 K/uL — ABNORMAL LOW (ref 150–400)
RBC: 4.89 MIL/uL (ref 3.87–5.11)
RDW: 22.1 % — ABNORMAL HIGH (ref 11.5–15.5)
WBC Count: 3.9 K/uL — ABNORMAL LOW (ref 4.0–10.5)
nRBC: 0 % (ref 0.0–0.2)

## 2023-10-31 LAB — IRON AND TIBC
Iron: 83 ug/dL (ref 28–170)
Saturation Ratios: 26 % (ref 10.4–31.8)
TIBC: 321 ug/dL (ref 250–450)
UIBC: 238 ug/dL

## 2023-10-31 LAB — FERRITIN: Ferritin: 29 ng/mL (ref 11–307)

## 2023-10-31 NOTE — Telephone Encounter (Signed)
 Iron panel labs are still in process @ 1642.

## 2023-10-31 NOTE — Telephone Encounter (Signed)
 Patient has been scheduled for follow-up visit per 10/31/23 LOS.  Pt given an appt calendar with date and time.

## 2023-11-01 ENCOUNTER — Encounter: Payer: Self-pay | Admitting: Oncology

## 2023-11-01 NOTE — Telephone Encounter (Signed)
 Latest Reference Range & Units 10/31/23 13:38  Iron 28 - 170 ug/dL 83  UIBC ug/dL 761  TIBC 749 - 549 ug/dL 678  Saturation Ratios 10.4 - 31.8 % 26  Ferritin 11 - 307 ng/mL 29   I am very pleased with the 5+ gram improvement in her hemoglobin since her IV iron was recently given.  Clinically, the patient appears to be doing well.  As mentioned previously, recent Cologuard testing came back negative.  She also had an unremarkable colonoscopy in 2023.  As her hematologic parameters are much better and she is clinically doing well, I will see her back in 6 months for repeat clinical assessment.  The patient understands all the plans discussed today and is in agreement with them.     Valaria DELENA Kerns, MD                Electronically signed by Kerns Valaria DELENA, MD at 10/31/2023  8:19 PM

## 2023-11-09 DIAGNOSIS — Z23 Encounter for immunization: Secondary | ICD-10-CM | POA: Diagnosis not present

## 2023-12-05 ENCOUNTER — Ambulatory Visit (HOSPITAL_BASED_OUTPATIENT_CLINIC_OR_DEPARTMENT_OTHER)
Admission: RE | Admit: 2023-12-05 | Discharge: 2023-12-05 | Disposition: A | Discharge status: Home / Self Care | Attending: Family Medicine | Admitting: Family Medicine

## 2023-12-05 ENCOUNTER — Encounter (HOSPITAL_BASED_OUTPATIENT_CLINIC_OR_DEPARTMENT_OTHER): Payer: Self-pay

## 2023-12-05 ENCOUNTER — Other Ambulatory Visit (HOSPITAL_BASED_OUTPATIENT_CLINIC_OR_DEPARTMENT_OTHER): Payer: Self-pay

## 2023-12-05 ENCOUNTER — Encounter: Payer: Self-pay | Admitting: Oncology

## 2023-12-05 VITALS — BP 117/76 | HR 78 | Temp 98.4°F | Resp 18

## 2023-12-05 DIAGNOSIS — J0100 Acute maxillary sinusitis, unspecified: Secondary | ICD-10-CM

## 2023-12-05 MED ORDER — AMOXICILLIN 875 MG PO TABS
875.0000 mg | ORAL_TABLET | Freq: Two times a day (BID) | ORAL | 0 refills | Status: AC
  Filled 2023-12-05: qty 14, 7d supply, fill #0

## 2023-12-05 MED ORDER — FLUCONAZOLE 150 MG PO TABS
150.0000 mg | ORAL_TABLET | Freq: Every day | ORAL | 0 refills | Status: AC
  Filled 2023-12-05: qty 2, 3d supply, fill #0

## 2023-12-05 MED ORDER — PROMETHAZINE-DM 6.25-15 MG/5ML PO SYRP
5.0000 mL | ORAL_SOLUTION | Freq: Four times a day (QID) | ORAL | 0 refills | Status: AC | PRN
  Filled 2023-12-05: qty 118, 6d supply, fill #0

## 2023-12-05 NOTE — ED Triage Notes (Signed)
 Pt c/o cough, sinus drainage and pressure x 1 week. Bilateral ear pain. Has taken mucinex and otc cough medicine.

## 2023-12-05 NOTE — ED Provider Notes (Signed)
 [facility]    CSN: [id number] Arrival date & time: 12/05/23  1458      History   Chief Complaint Chief Complaint  Patient presents with   Nasal Congestion    Cough, sinus pain - Entered by patient   Cough   Otalgia    HPI Elizabeth Randall is a 68 y.o. female.   Patient is a 68 year old female presents today with sinus congestion.  Pt c/o cough, sinus drainage and pressure x 1 week. Bilateral ear pain. Has taken mucinex and otc cough medicine. Symptoms have worsened.     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      Home Medications    Prior to Admission medications   Medication Sig Start Date End Date Taking? Authorizing Provider  amoxicillin (AMOXIL) 875 MG tablet Take 1 tablet (875 mg total) by mouth 2 (two) times daily for 7 days. 12/05/23 12/12/23 Yes Romelle Reiley A, FNP  fluconazole (DIFLUCAN) 150 MG tablet Take one tablet by mouth today and then one tablet in 3 days if symptoms remain 12/05/23 12/08/23 Yes Lesette Frary A, FNP  promethazine-dextromethorphan  (PROMETHAZINE-DM) 6.25-15 MG/5ML syrup Take 5 mLs by mouth 4 (four) times daily as needed for cough. 12/05/23  Yes Clayden Withem A, FNP  ALPRAZolam (XANAX) 0.25 MG tablet Take 0.25 mg by mouth 3 (three) times daily as needed. 12/26/18   [provider]  ALPRAZolam (XANAX) 0.25 MG tablet Take 0.25 mg by mouth 3 (three) times daily as needed for anxiety. 02/12/20   [provider]  AMITIZA 8 MCG capsule Take 8 mcg by mouth 2 (two) times daily. 01/02/19   [provider]  AMITIZA 8 MCG capsule Take 8 mcg by mouth 2 (two) times daily. 03/03/20   [provider]  citalopram (CELEXA) 20 MG tablet Take 20 mg by mouth daily. 12/04/18   [provider]  citalopram (CELEXA) 20 MG tablet Take 20 mg by mouth daily. 01/29/20   [provider]  estradiol (ESTRACE) 0.5 MG tablet  09/12/23   [provider]  HYDROcodone-acetaminophen  (NORCO/VICODIN) 5-325 MG tablet  01/18/19   [provider]  latanoprost (XALATAN) 0.005 % ophthalmic solution  09/06/23   [provider]  ondansetron  (ZOFRAN  ODT) 4 MG disintegrating tablet Take 1 tablet (4 mg total) by mouth every 8 (eight) hours as needed for nausea or vomiting.  03/12/20   Doretha Folks, MD  predniSONE  (DELTASONE ) 20 MG tablet Take 2 tablets (40 mg total) by mouth daily. 03/12/20   Doretha Folks, MD  predniSONE  (STERAPRED UNI-PAK 21 TAB) 10 MG (21) TBPK tablet Take as directed 01/24/19   Stover, Titorya, DPM  simvastatin (ZOCOR) 20 MG tablet Take 20 mg by mouth at bedtime. 01/02/19   [provider]  simvastatin (ZOCOR) 20 MG tablet Take 20 mg by mouth at bedtime. 01/14/20   [provider]  traMADol DANNY) 50 MG tablet  01/08/19   [provider]  Vitamin D, Ergocalciferol, (DRISDOL) 1.25 MG (50000 UNIT) CAPS capsule Take 50,000 Units by mouth once a week. 08/10/23   [provider]    Family History Family History  Problem Relation Age of Onset    Heart failure Mother    Heart failure Father    Lung cancer Father    AAA (abdominal aortic aneurysm) Father    Breast cancer Sister     Social History Social History   Tobacco Use   Smoking status: Never   Smokeless tobacco: Never  Vaping Use   Vaping status: Never Used  Substance Use Topics   Alcohol use: Not Currently   Drug use: Not Currently     Allergies   Celecoxib, Nisoldipine, Piroxicam, and Rofecoxib   Review of Systems Review of Systems  HENT:  Positive for ear pain.   Respiratory:  Positive for cough.      Physical Exam Triage Vital Signs ED Triage Vitals  Encounter Vitals Group     BP 12/05/23 1517 117/76     Girls Systolic BP Percentile --      Girls Diastolic BP Percentile --      Boys Systolic BP Percentile --      Boys Diastolic BP Percentile --      Pulse Rate 12/05/23 1517 78     Resp 12/05/23 1517 18     Temp 12/05/23 1517 98.4 F (36.9 C)     Temp Source 12/05/23 1517 Oral     SpO2 12/05/23 1517 97 %     Weight --      Height --      Head Circumference --      Peak Flow --      Pain Score 12/05/23 1518 7     Pain Loc --      Pain Education --      Exclude from Growth Chart --    No data found.  Updated Vital Signs BP 117/76 (BP Location: Right Arm)   Pulse 78   Temp 98.4 F (36.9 C) (Oral)   Resp 18   SpO2 97%   Visual Acuity Right Eye Distance:   Left Eye Distance:   Bilateral Distance:    Right Eye Near:   Left Eye Near:    Bilateral Near:     Physical Exam Constitutional:      General: She is not in acute distress.    Appearance: Normal appearance. She is not ill-appearing, toxic-appearing or diaphoretic.  HENT:     Head: Normocephalic and atraumatic.     Right Ear: Tympanic membrane and ear canal normal.     Left Ear: Tympanic membrane and ear canal normal.     Nose: Congestion present.     Mouth/Throat:     Pharynx: Oropharynx is clear.  Eyes:     Conjunctiva/sclera: Conjunctivae normal.  Cardiovascular:      Rate and Rhythm: Normal rate and  regular rhythm.     Pulses: Normal pulses.     Heart sounds: Normal heart sounds.  Pulmonary:     Effort: Pulmonary effort is normal.     Breath sounds: Normal breath sounds.  Skin:    General: Skin is warm and dry.  Neurological:     Mental Status: She is alert.  Psychiatric:        Mood and Affect: Mood normal.      UC Treatments / Results  Labs (all labs ordered are listed, but only abnormal results are displayed) Labs Reviewed - No data to display  EKG   Radiology No results found.  Procedures Procedures (including critical care time)  Medications Ordered in UC Medications - No data to display  Initial Impression / Assessment and Plan / UC Course  I have reviewed the triage vital signs and the nursing notes.  Pertinent labs & imaging results that were available during my care of the patient were reviewed by me and considered in my medical decision making (see chart for details).     Sinusitis-treating for sinus infection with amoxicillin.  Medication as prescribed.  Recommend over-the-counter Mucinex or sinus medication as needed. Prescribe some cough medication to use at bedtime to help with resting and controlling the cough. Diflucan for yeast infection Final Clinical Impressions(s) / UC Diagnoses   Final diagnoses:  Acute non-recurrent maxillary sinusitis     Discharge Instructions      You for a sinus infection.  Take the antibiotics as prescribed.  Continue with over-the-counter medications as needed to include Mucinex or sinus medication. I have also prescribed some cough medicine to use as needed at bedtime.   Follow-up as needed     ED Prescriptions     Medication Sig Dispense Auth. Provider   amoxicillin (AMOXIL) 875 MG tablet Take 1 tablet (875 mg total) by mouth 2 (two) times daily for 7 days. 14 tablet Fusako Tanabe A, FNP   fluconazole (DIFLUCAN) 150 MG tablet Take one tablet by mouth today and then  one tablet in 3 days if symptoms remain 2 tablet Devanee Pomplun A, FNP   promethazine-dextromethorphan (PROMETHAZINE-DM) 6.25-15 MG/5ML syrup Take 5 mLs by mouth 4 (four) times daily as needed for cough. 118 mL Adah Corning A, FNP      PDMP not reviewed this encounter.   Adah Corning LABOR, FNP 12/05/23 [phone]

## 2023-12-05 NOTE — Discharge Instructions (Addendum)
 You for a sinus infection.  Take the antibiotics as prescribed.  Continue with over-the-counter medications as needed to include Mucinex or sinus medication. I have also prescribed some cough medicine to use as needed at bedtime.   Follow-up as needed

## 2024-01-10 DIAGNOSIS — K5909 Other constipation: Secondary | ICD-10-CM | POA: Diagnosis not present

## 2024-01-10 DIAGNOSIS — E785 Hyperlipidemia, unspecified: Secondary | ICD-10-CM | POA: Diagnosis not present

## 2024-01-10 DIAGNOSIS — E559 Vitamin D deficiency, unspecified: Secondary | ICD-10-CM | POA: Diagnosis not present

## 2024-01-10 DIAGNOSIS — D509 Iron deficiency anemia, unspecified: Secondary | ICD-10-CM | POA: Diagnosis not present

## 2024-01-10 DIAGNOSIS — M810 Age-related osteoporosis without current pathological fracture: Secondary | ICD-10-CM | POA: Diagnosis not present

## 2024-01-10 DIAGNOSIS — D696 Thrombocytopenia, unspecified: Secondary | ICD-10-CM | POA: Diagnosis not present

## 2024-01-10 DIAGNOSIS — N898 Other specified noninflammatory disorders of vagina: Secondary | ICD-10-CM | POA: Diagnosis not present

## 2024-01-10 DIAGNOSIS — F419 Anxiety disorder, unspecified: Secondary | ICD-10-CM | POA: Diagnosis not present

## 2024-01-28 ENCOUNTER — Ambulatory Visit (HOSPITAL_BASED_OUTPATIENT_CLINIC_OR_DEPARTMENT_OTHER)

## 2024-04-30 ENCOUNTER — Other Ambulatory Visit

## 2024-04-30 ENCOUNTER — Ambulatory Visit: Admitting: Oncology
